# Patient Record
Sex: Male | Born: 1995 | Race: Black or African American | Hispanic: No | Marital: Single | Smoking: Current every day smoker
Health system: Southern US, Community
[De-identification: ages and names within clinical notes are randomized; demographics above are authoritative.]

## PROBLEM LIST (undated history)

## (undated) DIAGNOSIS — J45909 Unspecified asthma, uncomplicated: Secondary | ICD-10-CM

---
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
## 2017-06-12 ENCOUNTER — Telehealth: Payer: Self-pay | Admitting: Orthopedic Surgery

## 2017-06-12 NOTE — Telephone Encounter (Signed)
We received referral on this patient and have tried multple times to call and speak to him.  We have left messages but still have not heard back from him.  I have sent a message to pt's PCP, Dr. Bryna Colander at [facility] to let them know that we will close the referral for now.

## 2017-08-20 ENCOUNTER — Encounter: Payer: Self-pay | Admitting: Emergency Medicine

## 2017-08-20 ENCOUNTER — Ambulatory Visit
Admission: EM | Admit: 2017-08-20 | Discharge: 2017-08-20 | Disposition: A | Discharge status: Home / Self Care | Payer: Self-pay | Attending: Family Medicine | Admitting: Family Medicine

## 2017-08-20 DIAGNOSIS — J020 Streptococcal pharyngitis: Secondary | ICD-10-CM

## 2017-08-20 LAB — RAPID STREP SCREEN (MED CTR MEBANE ONLY): STREPTOCOCCUS, GROUP A SCREEN (DIRECT): POSITIVE — AB

## 2017-08-20 MED ORDER — AMOXICILLIN 875 MG PO TABS: 875.0000 mg | ORAL_TABLET | Freq: Two times a day (BID) | ORAL | 0 refills | Status: AC

## 2017-08-20 NOTE — Discharge Instructions (Addendum)
Take medication as prescribed. Rest. Drink plenty of fluids.  ° °Follow up with your primary care physician this week as needed. Return to Urgent care for new or worsening concerns.  ° °

## 2017-08-20 NOTE — ED Triage Notes (Signed)
Patient c/o sore throat started yesterday.  

## 2017-08-20 NOTE — ED Provider Notes (Signed)
[facility] ____________________________________________  Time seen: Approximately 6:51 PM  I have reviewed the triage vital signs and the nursing notes.   HISTORY  Chief Complaint Sore Throat   HPI Richard Nunez is a 22 y.o. male presenting with friend at bedside for evaluation of sore throat present since last night.  States painful to swallow it, stating severe sore throat.  Continues to tolerate food and fluids today.  Took some over-the-counter cough cold congestion combination medication this afternoon but no other over-the-counter medications taken for the same complaints.  Denies known sick contacts.  Not sharing drinks.  No others around him sick.  Reports otherwise feels well.  Does state he has had some nasal congestion intermittently for a few days but no cough.  Denies other aggravating or alleviating factors.  Denies recent sickness. Denies recent antibiotic use.    History reviewed. No pertinent past medical history. Denies   There are no active problems to display for this patient.   History reviewed. No pertinent surgical history.   No current facility-administered medications for this encounter.   Current Outpatient Medications:  .  amoxicillin (AMOXIL) 875 MG tablet, Take 1 tablet (875 mg total) by mouth 2 (two) times daily., Disp: 20 tablet, Rfl: 0  Allergies Patient has no known allergies.  History reviewed. No pertinent family history.  Social History Social History   Tobacco Use  . Smoking status: Current Every Day Smoker    Types: E-cigarettes  . Smokeless tobacco: Never Used  Substance Use Topics  . Alcohol use: Yes  . Drug use: Never    Review of Systems Constitutional: No fever/chills ENT: positive sore throat. Cardiovascular: Denies chest pain. Respiratory: Denies shortness of breath. Gastrointestinal: No abdominal pain.   Musculoskeletal: Negative for back pain. Skin: Negative for  rash.  ____________________________________________   PHYSICAL EXAM:  VITAL SIGNS: ED Triage Vitals  Enc Vitals Group     BP 08/20/17 1828 110/86     Pulse Rate 08/20/17 1828 67     Resp 08/20/17 1828 16     Temp 08/20/17 1828 97.8 F (36.6 C)     Temp Source 08/20/17 1828 Oral     SpO2 08/20/17 1828 99 %     Weight 08/20/17 1827 167 lb (75.8 kg)     Height 08/20/17 1827 5\' 8"  (1.727 m)     Head Circumference --      Peak Flow --      Pain Score 08/20/17 1827 10     Pain Loc --      Pain Edu? --      Excl. in GC? --    Constitutional: Alert and oriented. Well appearing and in no acute distress. Eyes: Conjunctivae are normal.  Head: Atraumatic. No sinus tenderness to palpation. No swelling. No erythema.  Ears: no erythema, normal TMs bilaterally.   Nose:No nasal congestion  Mouth/Throat: Mucous membranes are moist.moderate pharyngeal erythema.  Mild bilateral tonsillar swelling.  No exudate.  No uvular shift or deviation. Neck: No stridor.  No cervical spine tenderness to palpation. Hematological/Lymphatic/Immunilogical: Mild anterior bilateral cervical lymphadenopathy. Cardiovascular: Normal rate, regular rhythm. Grossly normal heart sounds.  Good peripheral circulation. Respiratory: Normal respiratory effort.  No retractions. No wheezes, rales or rhonchi. Good air movement.  Musculoskeletal: Ambulatory with steady gait.  Neurologic:  Normal speech and language. No gait instability. Skin:  Skin appears warm, dry and intact. No rash noted. Psychiatric: Mood and affect are normal. Speech and behavior are normal.  ___________________________________________  LABS (all labs ordered are listed, but only abnormal results are displayed)  Labs Reviewed  RAPID STREP SCREEN ([facility] ONLY) - Abnormal; Notable for the following components:      Result Value   Streptococcus, Group A Screen (Direct) POSITIVE (*)    All other components within normal limits     PROCEDURES Procedures    INITIAL IMPRESSION / ASSESSMENT AND PLAN / ED COURSE  Pertinent labs & imaging results that were available during my care of the patient were reviewed by me and considered in my medical decision making (see chart for details).  Well-appearing patient.  No acute distress.  Strep positive discussed treatment options with patient, patient elects oral treatment.  Will treat with oral amoxicillin.  Encourage rest, fluids, supportive care.  Work note given for today and tomorrow.Discussed indication, risks and benefits of medications with patient.  Discussed follow up with Primary care physician this week as needed. Discussed follow up and return parameters including no resolution or any worsening concerns. Patient verbalized understanding and agreed to plan.   ____________________________________________   FINAL CLINICAL IMPRESSION(S) / ED DIAGNOSES  Final diagnoses:  Strep throat     ED Discharge Orders        Ordered    amoxicillin (AMOXIL) 875 MG tablet  2 times daily     08/20/17 1849       Note: This dictation was prepared with Dragon dictation along with smaller phrase technology. Any transcriptional errors that result from this process are unintentional.         Renford Dills, NP 08/20/17 1902

## 2018-09-26 ENCOUNTER — Emergency Department (HOSPITAL_COMMUNITY)
Admission: EM | Admit: 2018-09-26 | Discharge: 2018-09-26 | Disposition: A | Discharge status: Home / Self Care | Payer: Medicaid Other | Attending: Emergency Medicine | Admitting: Emergency Medicine

## 2018-09-26 ENCOUNTER — Other Ambulatory Visit: Payer: Self-pay

## 2018-09-26 ENCOUNTER — Emergency Department (HOSPITAL_COMMUNITY): Payer: Medicaid Other

## 2018-09-26 ENCOUNTER — Encounter (HOSPITAL_COMMUNITY): Payer: Self-pay | Admitting: Emergency Medicine

## 2018-09-26 DIAGNOSIS — S01531A Puncture wound without foreign body of lip, initial encounter: Secondary | ICD-10-CM | POA: Diagnosis not present

## 2018-09-26 DIAGNOSIS — F17290 Nicotine dependence, other tobacco product, uncomplicated: Secondary | ICD-10-CM | POA: Diagnosis not present

## 2018-09-26 DIAGNOSIS — S025XXA Fracture of tooth (traumatic), initial encounter for closed fracture: Secondary | ICD-10-CM | POA: Insufficient documentation

## 2018-09-26 DIAGNOSIS — S81812A Laceration without foreign body, left lower leg, initial encounter: Secondary | ICD-10-CM | POA: Insufficient documentation

## 2018-09-26 DIAGNOSIS — Y9389 Activity, other specified: Secondary | ICD-10-CM | POA: Insufficient documentation

## 2018-09-26 DIAGNOSIS — Y999 Unspecified external cause status: Secondary | ICD-10-CM | POA: Insufficient documentation

## 2018-09-26 DIAGNOSIS — Y929 Unspecified place or not applicable: Secondary | ICD-10-CM | POA: Insufficient documentation

## 2018-09-26 DIAGNOSIS — S01511A Laceration without foreign body of lip, initial encounter: Secondary | ICD-10-CM

## 2018-09-26 DIAGNOSIS — S81811A Laceration without foreign body, right lower leg, initial encounter: Secondary | ICD-10-CM

## 2018-09-26 MED ORDER — IBUPROFEN 800 MG PO TABS
800.0000 mg | ORAL_TABLET | Freq: Once | ORAL | Status: AC
  Administered 2018-09-26: 800 mg via ORAL
  Filled 2018-09-26: qty 1

## 2018-09-26 MED ORDER — TRAMADOL HCL 50 MG PO TABS: 50.0000 mg | ORAL_TABLET | Freq: Four times a day (QID) | ORAL | 0 refills | Status: AC | PRN

## 2018-09-26 MED ORDER — TRAMADOL HCL 50 MG PO TABS
50.0000 mg | ORAL_TABLET | Freq: Once | ORAL | Status: AC
  Administered 2018-09-26: 50 mg via ORAL
  Filled 2018-09-26: qty 1

## 2018-09-26 MED ORDER — LIDOCAINE HCL (PF) 1 % IJ SOLN
5.0000 mL | Freq: Once | INTRAMUSCULAR | Status: DC
  Filled 2018-09-26: qty 6

## 2018-09-26 MED ORDER — IBUPROFEN 800 MG PO TABS: 800.0000 mg | ORAL_TABLET | Freq: Three times a day (TID) | ORAL | 0 refills | Status: DC

## 2018-09-26 MED ORDER — LIDOCAINE-EPINEPHRINE-TETRACAINE (LET) SOLUTION
3.0000 mL | Freq: Once | NASAL | Status: AC
  Administered 2018-09-26: 3 mL via TOPICAL
  Filled 2018-09-26: qty 3

## 2018-09-26 NOTE — ED Notes (Signed)
To CT

## 2018-09-26 NOTE — Discharge Instructions (Addendum)
Keep your wound clean and dry until you have a good scab has formed, you may then wash with mild soap and water, but dry completely to avoid softening of the scab.  The staples will need to be removed in 12 days, if your primary doctor cannot remove staples, return here for this.  The suture in your mouth will dissolve on its own.  You may use peroxide and water swish and spit to the area to help keep this clean.  Call your dentist tomorrow for an appointment for further treatment of your fractured tooth.  I recommend ibuprofen as prescribed for pain relief.  You may also get the tramadol medication filled if the dose we gave you here was effective.  If not I recommend adding Tylenol 1000 mg every 8 hours with your dose of prescribed ibuprofen.  If you get the tramadol medication filled, do not drive within 4 hours of taking this medication as it will make you drowsy.

## 2018-09-26 NOTE — ED Notes (Signed)
From CT 
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
## 2018-09-26 NOTE — ED Notes (Signed)
Family member to room  

## 2018-09-26 NOTE — ED Notes (Signed)
Mother in w patient

## 2018-09-26 NOTE — ED Notes (Signed)
JI in to repair

## 2019-07-26 ENCOUNTER — Emergency Department (HOSPITAL_COMMUNITY): Payer: Medicaid Other

## 2019-07-26 ENCOUNTER — Other Ambulatory Visit: Payer: Self-pay

## 2019-07-26 ENCOUNTER — Encounter (HOSPITAL_COMMUNITY): Payer: Self-pay | Admitting: *Deleted

## 2019-07-26 ENCOUNTER — Emergency Department (HOSPITAL_COMMUNITY)
Admission: EM | Admit: 2019-07-26 | Discharge: 2019-07-26 | Disposition: A | Discharge status: Home / Self Care | Payer: Medicaid Other | Attending: Emergency Medicine | Admitting: Emergency Medicine

## 2019-07-26 DIAGNOSIS — F17290 Nicotine dependence, other tobacco product, uncomplicated: Secondary | ICD-10-CM | POA: Diagnosis not present

## 2019-07-26 DIAGNOSIS — M25561 Pain in right knee: Secondary | ICD-10-CM | POA: Diagnosis not present

## 2019-07-26 MED ORDER — IBUPROFEN 600 MG PO TABS: 600.0000 mg | ORAL_TABLET | Freq: Four times a day (QID) | ORAL | 0 refills | Status: AC | PRN
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
## 2019-07-26 NOTE — ED Provider Notes (Signed)
[facility] EMERGENCY DEPARTMENT Provider Note   CSN: [id number] Arrival date & time: 07/26/19  1431     History Chief Complaint  Patient presents with  . Knee Pain    Richard Nunez is a 24 y.o. male presented with right knee pain.  He describes having an old injury to the same knee in 2018 and was told by an orthopedist that it was a meniscal injury.  His pain had completely resolved until he started working a job several months ago requiring standing and bending for 8 to 10 hours daily.  He has tried ice which sometimes improve his symptoms especially after a day at work.  He has had no other treatments for this problem.  He does endorse intermittent popping and clicking of the knee joint with bending the knee.  No radiation of pain.  The history is provided by the patient.       History reviewed. No pertinent past medical history.  There are no problems to display for this patient.   History reviewed. No pertinent surgical history.     History reviewed. No pertinent family history.  Social History   Tobacco Use  . Smoking status: Current Every Day Smoker    Types: E-cigarettes  . Smokeless tobacco: Never Used  Vaping Use  . Vaping Use: Every day  Substance Use Topics  . Alcohol use: Yes  . Drug use: Never    Home Medications Prior to Admission medications   Medication Sig Start Date End Date Taking? Authorizing Provider  amoxicillin (AMOXIL) 875 MG tablet Take 1 tablet (875 mg total) by mouth 2 (two) times daily. 08/20/17   Marylene Land, NP  ibuprofen (ADVIL) 600 MG tablet Take 1 tablet (600 mg total) by mouth every 6 (six) hours as needed. 07/26/19   Evalee Jefferson, PA-C  traMADol (ULTRAM) 50 MG tablet Take 1 tablet (50 mg total) by mouth every 6 (six) hours as needed. 09/26/18   Evalee Jefferson, PA-C    Allergies    Patient has no known allergies.  Review of Systems   Review of Systems  Constitutional: Negative for fever.  Musculoskeletal: Positive for  arthralgias. Negative for joint swelling and myalgias.  Neurological: Negative for weakness and numbness.    Physical Exam Updated Vital Signs BP 104/66 (BP Location: Right Arm)   Pulse 78   Temp 97.9 F (36.6 C) (Oral)   Resp 12   Ht 5\' 8"  (1.727 m)   Wt 76.2 kg   SpO2 99%   BMI 25.54 kg/m   Physical Exam Constitutional:      Appearance: He is well-developed.  HENT:     Head: Atraumatic.  Cardiovascular:     Comments: Pulses equal bilaterally Musculoskeletal:        General: Tenderness present.     Cervical back: Normal range of motion.     Right knee: No effusion, erythema, ecchymosis or crepitus. Normal range of motion. Tenderness present. No LCL laxity, MCL laxity, ACL laxity or PCL laxity.     Instability Tests: Anterior drawer test negative. Posterior drawer test negative.     Comments: There is tenderness along the right lateral anterior knee joint, additionally at the proximal lateral collateral ligament.  There is no palpable deformity.  No effusion, no crepitus with passive range of motion.  Skin:    General: Skin is warm and dry.  Neurological:     Mental Status: He is alert.     Sensory: No sensory deficit.  Deep Tendon Reflexes: Reflexes normal.     ED Results / Procedures / Treatments   Labs (all labs ordered are listed, but only abnormal results are displayed) Labs Reviewed - No data to display  EKG None  Radiology DG Knee Complete 4 Views Right  Result Date: 07/26/2019 CLINICAL DATA:  Atraumatic right knee pain. EXAM: RIGHT KNEE - COMPLETE 4+ VIEW COMPARISON:  None. FINDINGS: No evidence of fracture, dislocation, or joint effusion. No evidence of arthropathy or other focal bone abnormality. Soft tissues are unremarkable. IMPRESSION: Negative. Electronically Signed   By: Aram Candela M.D.   On: 07/26/2019 15:18    Procedures Procedures (including critical care time)  Medications Ordered in ED Medications - No data to display  ED Course   I have reviewed the triage vital signs and the nursing notes.  Pertinent labs & imaging results that were available during my care of the patient were reviewed by me and considered in my medical decision making (see chart for details).    MDM Rules/Calculators/A&P                          Imaging reviewed and discussed with patient which is a normal imaging study.  He is tender along his lateral meniscus also along the lateral collateral ligaments.  There is no crepitus, no popping, clicking or locking with range of motion movements and no laxity.  Possible flare of his old suspected meniscal injury versus a lateral collateral ligament strain.  He was placed in a knee sleeve for comfort.  Discussed roles of ice and heat.  Ibuprofen.  Referral to orthopedics for further evaluation if symptoms persist. Final Clinical Impression(s) / ED Diagnoses Final diagnoses:  Acute pain of right knee    Rx / DC Orders ED Discharge Orders         Ordered    ibuprofen (ADVIL) 600 MG tablet  Every 6 hours PRN     Discontinue  Reprint     07/26/19 1549           Burgess Amor, PA-C 07/26/19 1555    Long, Arlyss Repress, MD 07/27/19 [phone]

## 2019-07-26 NOTE — ED Notes (Signed)
ED Provider at bedside. 

## 2019-07-26 NOTE — Discharge Instructions (Signed)
Take the ibuprofen for pain relief as needed.  Application of ice to the knee, especially after a long day at work may help ease aching pain.  A heating pad applied for 20 minutes several times daily can also help speed recovery of minor soft tissue injuries such as a ligament strain.  Your x-ray is negative.  As discussed it is possible this is a ligament strain, or a flare of your old meniscal injury.  Call orthopedist for further evaluation of your symptoms.

## 2019-10-25 ENCOUNTER — Other Ambulatory Visit: Payer: Self-pay

## 2019-10-25 ENCOUNTER — Emergency Department (HOSPITAL_COMMUNITY)
Admission: EM | Admit: 2019-10-25 | Discharge: 2019-10-26 | Disposition: A | Discharge status: Home / Self Care | Payer: Medicaid Other | Attending: Emergency Medicine | Admitting: Emergency Medicine

## 2019-10-25 ENCOUNTER — Emergency Department (HOSPITAL_COMMUNITY): Payer: Medicaid Other

## 2019-10-25 ENCOUNTER — Encounter (HOSPITAL_COMMUNITY): Payer: Self-pay | Admitting: Emergency Medicine

## 2019-10-25 DIAGNOSIS — M542 Cervicalgia: Secondary | ICD-10-CM | POA: Insufficient documentation

## 2019-10-25 DIAGNOSIS — M25571 Pain in right ankle and joints of right foot: Secondary | ICD-10-CM | POA: Diagnosis not present

## 2019-10-25 DIAGNOSIS — T07XXXA Unspecified multiple injuries, initial encounter: Secondary | ICD-10-CM

## 2019-10-25 DIAGNOSIS — Y92410 Unspecified street and highway as the place of occurrence of the external cause: Secondary | ICD-10-CM | POA: Diagnosis not present

## 2019-10-25 DIAGNOSIS — F17210 Nicotine dependence, cigarettes, uncomplicated: Secondary | ICD-10-CM | POA: Diagnosis not present

## 2019-10-25 DIAGNOSIS — Z79899 Other long term (current) drug therapy: Secondary | ICD-10-CM | POA: Diagnosis not present

## 2019-10-25 DIAGNOSIS — M546 Pain in thoracic spine: Secondary | ICD-10-CM | POA: Insufficient documentation

## 2019-10-25 DIAGNOSIS — J45909 Unspecified asthma, uncomplicated: Secondary | ICD-10-CM | POA: Insufficient documentation

## 2019-10-25 HISTORY — DX: Unspecified asthma, uncomplicated: J45.909
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
## 2019-10-26 ENCOUNTER — Emergency Department (HOSPITAL_COMMUNITY): Payer: Medicaid Other

## 2019-10-26 NOTE — Discharge Instructions (Addendum)
Take ibuprofen 600 mg every 6 hours as needed for pain.  Rest.  Apply ice to affected areas for 20 minutes every 2 hours while awake for the next 2 days as needed for pain/swelling.
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
## 2023-11-10 ENCOUNTER — Ambulatory Visit: Admitting: Physician Assistant
